# Patient Record
Sex: Male | Born: 1970 | Race: White | Hispanic: No | Marital: Married
Health system: Midwestern US, Academic
[De-identification: ages and names within clinical notes are randomized; demographics above are authoritative.]

---

## 2017-02-11 LAB — COMPREHENSIVE METABOLIC PANEL
Lab: 139
Lab: 18 — ABNORMAL HIGH (ref <150)
Lab: 4.2
Lab: 4.4
Lab: 9.9
Lab: 96

## 2017-02-11 LAB — CBC
Lab: 48
Lab: 7.4

## 2017-06-13 LAB — LIPID PROFILE
Lab: 103 — ABNORMAL HIGH (ref <100)
Lab: 180
Lab: 44

## 2017-08-02 ENCOUNTER — Encounter: Admit: 2017-08-02 | Discharge: 2017-08-02 | Payer: BC Managed Care – PPO

## 2017-08-05 MED ORDER — LOSARTAN 50 MG PO TAB
ORAL_TABLET | Freq: Every day | ORAL | 0 refills | 30.00000 days | Status: AC
Start: 2017-08-05 — End: 2017-08-08

## 2017-08-08 ENCOUNTER — Encounter: Admit: 2017-08-08 | Discharge: 2017-08-08 | Payer: BC Managed Care – PPO

## 2017-08-08 ENCOUNTER — Ambulatory Visit: Admit: 2017-08-08 | Discharge: 2017-08-09 | Payer: BC Managed Care – PPO

## 2017-08-08 DIAGNOSIS — R002 Palpitations: Principal | ICD-10-CM

## 2017-08-08 DIAGNOSIS — Z8249 Family history of ischemic heart disease and other diseases of the circulatory system: ICD-10-CM

## 2017-08-08 DIAGNOSIS — F419 Anxiety disorder, unspecified: ICD-10-CM

## 2017-08-08 DIAGNOSIS — I10 Essential (primary) hypertension: ICD-10-CM

## 2017-08-08 DIAGNOSIS — E6609 Other obesity due to excess calories: ICD-10-CM

## 2017-08-08 DIAGNOSIS — I4510 Unspecified right bundle-branch block: ICD-10-CM

## 2017-08-08 MED ORDER — AMLODIPINE 5 MG PO TAB
5 mg | ORAL_TABLET | Freq: Two times a day (BID) | ORAL | 3 refills | Status: AC
Start: 2017-08-08 — End: 2019-07-09

## 2017-08-08 MED ORDER — LOSARTAN 50 MG PO TAB
50 mg | ORAL_TABLET | Freq: Two times a day (BID) | ORAL | 3 refills | 30.00000 days | Status: AC
Start: 2017-08-08 — End: 2018-03-04

## 2017-08-08 NOTE — Progress Notes
Date of Service: 08/08/2017    Mark Pace is a 46 y.o. male.       HPI     This is a 46 year old white male that we have been following up in our office for hypertension and risk factors for coronary artery disease.  Patient was last seen on May 24, 2016.    Today his blood pressure was 164/1 104 mm making the right arm and 154/100 mmHg in the left arm with a heart rate of 78 bpm.  Patient reports that he is compliant with his medications.  He is currently taking amlodipine 10 mg p.o. daily and losartan 50 mg p.o. daily.  He also tries to watch his salt intake.    He has not had any symptoms of chest pain or heart palpitations.    Patient was evaluated with a stress echocardiogram in September 2017, the EKG portion of the study was somewhat difficult to interpret due to artifact, occasional PVCs were present, there are no wall motion, mitis with exercise and thus this test was negative for ischemia.    Also Holter monitor was performed on 05/29/2016, he was not found to have any arrhythmias.         Vitals:    08/08/17 1504   BP: (!) 164/104   Weight: 113.9 kg (251 lb)   Height: 1.854 m (6' 1)     Body mass index is 33.12 kg/m???.     Past Medical History  Patient Active Problem List    Diagnosis Date Noted   ??? Non morbid obesity due to excess calories 04/17/2016   ??? RBBB 04/17/2016   ??? Palpitations 04/12/2016   ??? Hypertension 04/12/2016   ??? Anxiety 04/12/2016   ??? Family history of early CAD 04/12/2016         Review of Systems   Constitution: Negative.   HENT: Positive for ear discharge.    Eyes: Negative.    Cardiovascular: Positive for palpitations.   Respiratory: Negative.    Endocrine: Negative.    Hematologic/Lymphatic: Negative.    Skin: Negative.    Musculoskeletal: Positive for back pain.   Gastrointestinal: Positive for bloating, abdominal pain, constipation and diarrhea.   Genitourinary: Negative.    Neurological: Negative.    Psychiatric/Behavioral: Negative.    Allergic/Immunologic: Negative. Physical Exam  General Appearance: normal in appearance  Skin: warm, moist, no ulcers or xanthomas  Eyes: conjunctivae and lids normal, pupils are equal and round  Lips & Oral Mucosa: no pallor or cyanosis  Neck Veins: neck veins are flat, neck veins are not distended  Chest Inspection: chest is normal in appearance  Respiratory Effort: breathing comfortably, no respiratory distress  Auscultation/Percussion: lungs clear to auscultation, no rales or rhonchi, no wheezing  Cardiac Rhythm: regular rhythm and normal rate  Cardiac Auscultation: S1, S2 normal, no rub, no gallop  Murmurs: no murmur  Carotid Arteries: normal carotid upstroke bilaterally, no bruit  Lower Extremity Edema: no lower extremity edema  Abdominal Exam: soft, non-tender, no masses, bowel sounds normal  Liver & Spleen: no organomegaly  Language and Memory: patient responsive and seems to comprehend information  Neurologic Exam: neurological assessment grossly intact      Cardiovascular Studies  Twelve-lead EKG demonstrates normal sinus rhythm, right bundle branch block    Problems Addressed Today  Encounter Diagnoses   Name Primary?   ??? Essential hypertension Yes   ??? RBBB    ??? Palpitations    ??? Anxiety    ??? Family history  of early CAD    ??? Non morbid obesity due to excess calories        Assessment and Plan     In summary: This is a 46 year old white male with no documented history of coronary artery disease, suboptimally controlled blood pressure, patient reports compliance with medications and the low-salt diet, she also has a right bundle branch block on the twelve-lead surface EKG.    In the light of the above I recommend the following:    1. Change amlodipine to 5 mg p.o. twice daily  2. Increase losartan to 50 mg p.o. twice daily  3. I did recommend a low-salt diet  4. I also asked the patient to buy a blood pressure machine at home and monitor his blood pressure and heart rate at home 5. Follow-up office visit in 3 months, patient will bring with him the home recordings, and based on those numbers will make further adjustments in his antihypertensive drug therapy, if necessary.             Current Medications (including today's revisions)  ??? acetaminophen (TYLENOL) 500 mg tablet Take 500 mg by mouth twice daily as needed for Pain. Max of 4,000 mg of acetaminophen in 24 hours.   ??? amLODIPine (NORVASC) 10 mg tablet Take 10 mg by mouth daily.   ??? duloxetine DR (CYMBALTA) 60 mg capsule Take 60 mg by mouth daily.   ??? losartan (COZAAR) 50 mg tablet TAKE ONE TABLET BY MOUTH ONCE DAILY AT BEDTIME

## 2017-08-22 ENCOUNTER — Encounter: Admit: 2017-08-22 | Discharge: 2017-08-22 | Payer: BC Managed Care – PPO

## 2017-09-05 LAB — COMPREHENSIVE METABOLIC PANEL
Lab: 0.5
Lab: 0.8 — ABNORMAL LOW (ref 33.0–37.0)
Lab: 10
Lab: 103
Lab: 138
Lab: 15
Lab: 21
Lab: 25
Lab: 31
Lab: 4.2
Lab: 7.4
Lab: 87
Lab: 92

## 2017-09-05 LAB — CBC: Lab: 6.3

## 2017-11-12 ENCOUNTER — Encounter: Admit: 2017-11-12 | Discharge: 2017-11-12 | Payer: BC Managed Care – PPO

## 2017-11-12 ENCOUNTER — Ambulatory Visit: Admit: 2017-11-12 | Discharge: 2017-11-13 | Payer: BC Managed Care – PPO

## 2017-11-12 DIAGNOSIS — I4510 Unspecified right bundle-branch block: ICD-10-CM

## 2017-11-12 DIAGNOSIS — I10 Essential (primary) hypertension: ICD-10-CM

## 2017-11-12 DIAGNOSIS — R002 Palpitations: Principal | ICD-10-CM

## 2017-11-12 DIAGNOSIS — Z8249 Family history of ischemic heart disease and other diseases of the circulatory system: ICD-10-CM

## 2017-11-12 DIAGNOSIS — E6609 Other obesity due to excess calories: ICD-10-CM

## 2017-11-12 DIAGNOSIS — F419 Anxiety disorder, unspecified: ICD-10-CM

## 2017-11-12 DIAGNOSIS — E782 Mixed hyperlipidemia: ICD-10-CM

## 2017-11-12 MED ORDER — HYDROCHLOROTHIAZIDE 25 MG PO TAB
25 mg | ORAL_TABLET | Freq: Every morning | ORAL | 3 refills | 28.00000 days | Status: AC
Start: 2017-11-12 — End: 2018-03-12

## 2018-03-04 ENCOUNTER — Encounter: Admit: 2018-03-04 | Discharge: 2018-03-04 | Payer: BC Managed Care – PPO

## 2018-03-04 ENCOUNTER — Ambulatory Visit: Admit: 2018-03-04 | Discharge: 2018-03-05 | Payer: BC Managed Care – PPO

## 2018-03-04 DIAGNOSIS — E6609 Other obesity due to excess calories: ICD-10-CM

## 2018-03-04 DIAGNOSIS — E782 Mixed hyperlipidemia: ICD-10-CM

## 2018-03-04 DIAGNOSIS — I4510 Unspecified right bundle-branch block: ICD-10-CM

## 2018-03-04 DIAGNOSIS — R002 Palpitations: ICD-10-CM

## 2018-03-04 DIAGNOSIS — R634 Abnormal weight loss: ICD-10-CM

## 2018-03-04 DIAGNOSIS — Z8249 Family history of ischemic heart disease and other diseases of the circulatory system: ICD-10-CM

## 2018-03-04 DIAGNOSIS — I10 Essential (primary) hypertension: ICD-10-CM

## 2018-03-04 DIAGNOSIS — F419 Anxiety disorder, unspecified: ICD-10-CM

## 2018-03-04 MED ORDER — LOSARTAN 50 MG PO TAB
50 mg | ORAL_TABLET | Freq: Two times a day (BID) | ORAL | 3 refills | 30.00000 days | Status: AC
Start: 2018-03-04 — End: 2019-07-09

## 2018-03-06 ENCOUNTER — Encounter: Admit: 2018-03-06 | Discharge: 2018-03-06 | Payer: BC Managed Care – PPO

## 2018-03-06 DIAGNOSIS — I10 Essential (primary) hypertension: Principal | ICD-10-CM

## 2018-03-12 ENCOUNTER — Encounter: Admit: 2018-03-12 | Discharge: 2018-03-12 | Payer: BC Managed Care – PPO

## 2018-03-12 MED ORDER — HYDROCHLOROTHIAZIDE 25 MG PO TAB
ORAL_TABLET | Freq: Every day | ORAL | 3 refills | 28.00000 days | Status: AC
Start: 2018-03-12 — End: 2018-06-12

## 2018-03-17 ENCOUNTER — Encounter: Admit: 2018-03-17 | Discharge: 2018-03-17 | Payer: BC Managed Care – PPO

## 2018-06-04 LAB — LIPID PROFILE

## 2018-06-12 ENCOUNTER — Encounter: Admit: 2018-06-12 | Discharge: 2018-06-12 | Payer: BC Managed Care – PPO

## 2018-06-12 MED ORDER — HYDROCHLOROTHIAZIDE 25 MG PO TAB
25 mg | ORAL_TABLET | Freq: Every day | ORAL | 3 refills | 28.00000 days | Status: AC
Start: 2018-06-12 — End: 2018-10-07

## 2018-10-07 ENCOUNTER — Ambulatory Visit: Admit: 2018-10-07 | Discharge: 2018-10-07 | Payer: BC Managed Care – PPO

## 2018-10-07 ENCOUNTER — Encounter: Admit: 2018-10-07 | Discharge: 2018-10-07 | Payer: BC Managed Care – PPO

## 2018-10-07 DIAGNOSIS — R002 Palpitations: Principal | ICD-10-CM

## 2018-10-07 DIAGNOSIS — E7800 Pure hypercholesterolemia, unspecified: Principal | ICD-10-CM

## 2018-10-07 DIAGNOSIS — I4510 Unspecified right bundle-branch block: ICD-10-CM

## 2018-10-07 DIAGNOSIS — F419 Anxiety disorder, unspecified: ICD-10-CM

## 2018-10-07 DIAGNOSIS — Z8249 Family history of ischemic heart disease and other diseases of the circulatory system: ICD-10-CM

## 2018-10-07 DIAGNOSIS — I10 Essential (primary) hypertension: ICD-10-CM

## 2018-10-07 DIAGNOSIS — E782 Mixed hyperlipidemia: ICD-10-CM

## 2018-10-07 MED ORDER — CHLORTHALIDONE 25 MG PO TAB
25 mg | ORAL_TABLET | Freq: Every day | ORAL | 3 refills | Status: AC
Start: 2018-10-07 — End: 2019-02-04

## 2018-10-07 NOTE — Progress Notes
Gastrointestinal: Positive for bloating, abdominal pain, constipation, diarrhea, heartburn and nausea.   Genitourinary: Negative.    Neurological: Positive for headaches.   Psychiatric/Behavioral: Negative.    Allergic/Immunologic: Negative.        Physical Exam  General Appearance: normal in appearance  Skin: warm, moist, no ulcers or xanthomas  Eyes: conjunctivae and lids normal, pupils are equal and round  Lips & Oral Mucosa: no pallor or cyanosis  Neck Veins: neck veins are flat, neck veins are not distended  Chest Inspection: chest is normal in appearance  Respiratory Effort: breathing comfortably, no respiratory distress  Auscultation/Percussion: lungs clear to auscultation, no rales or rhonchi, no wheezing  Cardiac Rhythm: regular rhythm and normal rate  Cardiac Auscultation: S1, S2 normal, no rub, no gallop  Murmurs: no murmur  Carotid Arteries: normal carotid upstroke bilaterally, no bruit  Lower Extremity Edema: no lower extremity edema  Abdominal Exam: soft, non-tender, no masses, bowel sounds normal  Liver & Spleen: no organomegaly  Language and Memory: patient responsive and seems to comprehend information  Neurologic Exam: neurological assessment grossly intact      Cardiovascular Studies  Twelve-lead EKG demonstrates normal sinus rhythm, right bundle branch block, left axis deviation and left anterior fascicular block    Problems Addressed Today  Encounter Diagnoses   Name Primary?   ??? Pure hypercholesterolemia Yes   ??? Essential hypertension    ??? RBBB    ??? Mixed hyperlipidemia    ??? Family history of early CAD    ??? Palpitations        Assessment and Plan     In summary: This is a 48 year old white male with nonobstructive renal artery stenosis by abdominal ultrasound performed at the outside hospital, he does have suboptimally controlled hypertension, hyperlipidemia, he is probably not completely compliant with a low-salt diet.    Plan:    1.  Continue amlodipine and continue losartan

## 2018-10-09 ENCOUNTER — Encounter: Admit: 2018-10-09 | Discharge: 2018-10-09 | Payer: BC Managed Care – PPO

## 2018-12-16 ENCOUNTER — Encounter: Admit: 2018-12-16 | Discharge: 2018-12-16 | Payer: BC Managed Care – PPO

## 2018-12-17 ENCOUNTER — Ambulatory Visit: Admit: 2018-12-17 | Discharge: 2018-12-18 | Payer: BC Managed Care – PPO

## 2018-12-17 ENCOUNTER — Encounter: Admit: 2018-12-17 | Discharge: 2018-12-17 | Payer: BC Managed Care – PPO

## 2018-12-17 DIAGNOSIS — E7800 Pure hypercholesterolemia, unspecified: Principal | ICD-10-CM

## 2018-12-17 DIAGNOSIS — Z8249 Family history of ischemic heart disease and other diseases of the circulatory system: ICD-10-CM

## 2018-12-17 DIAGNOSIS — I10 Essential (primary) hypertension: ICD-10-CM

## 2018-12-17 DIAGNOSIS — I4510 Unspecified right bundle-branch block: Principal | ICD-10-CM

## 2018-12-17 DIAGNOSIS — F419 Anxiety disorder, unspecified: ICD-10-CM

## 2018-12-17 DIAGNOSIS — E6609 Other obesity due to excess calories: ICD-10-CM

## 2018-12-17 DIAGNOSIS — E782 Mixed hyperlipidemia: ICD-10-CM

## 2018-12-17 DIAGNOSIS — R002 Palpitations: Principal | ICD-10-CM

## 2018-12-17 MED ORDER — PERFLUTREN LIPID MICROSPHERES 1.1 MG/ML IV SUSP
1-20 mL | Freq: Once | INTRAVENOUS | 0 refills | Status: CP | PRN
Start: 2018-12-17 — End: ?

## 2018-12-17 NOTE — Progress Notes
1.  I did asked the patient to continue all current medications  2.  We did discuss again about the importance of low-salt diet, low sugar, low carbohydrate, low-fat and low-cholesterol  3.  Follow-up office visit in approximately 6 months.         Current Medications (including today's revisions)  ??? acetaminophen (TYLENOL) 500 mg tablet Take 500 mg by mouth twice daily as needed for Pain. Max of 4,000 mg of acetaminophen in 24 hours.   ??? amLODIPine (NORVASC) 5 mg tablet Take one tablet by mouth twice daily.   ??? chlorthalidone (HYGROTON) 25 mg tablet Take one tablet by mouth daily. Indications: high blood pressure   ??? duloxetine DR (CYMBALTA) 30 mg capsule Take 30 mg by mouth at bedtime daily.   ??? duloxetine DR (CYMBALTA) 60 mg capsule Take 60 mg by mouth daily.   ??? losartan (COZAAR) 50 mg tablet Take one tablet by mouth twice daily. Indications: high blood pressure   ??? psyllium (METAMUCIL) 3.4 gram packet Take 1 packet by mouth daily.

## 2019-02-03 ENCOUNTER — Encounter: Admit: 2019-02-03 | Discharge: 2019-02-03

## 2019-02-04 MED ORDER — CHLORTHALIDONE 25 MG PO TAB
ORAL_TABLET | Freq: Every day | 3 refills | Status: DC
Start: 2019-02-04 — End: 2019-07-09

## 2019-06-30 ENCOUNTER — Encounter: Admit: 2019-06-30 | Discharge: 2019-06-30 | Payer: BC Managed Care – PPO

## 2019-07-09 ENCOUNTER — Encounter: Admit: 2019-07-09 | Discharge: 2019-07-09 | Payer: BC Managed Care – PPO

## 2019-07-09 DIAGNOSIS — I4510 Unspecified right bundle-branch block: Secondary | ICD-10-CM

## 2019-07-09 DIAGNOSIS — Z8249 Family history of ischemic heart disease and other diseases of the circulatory system: Secondary | ICD-10-CM

## 2019-07-09 DIAGNOSIS — R002 Palpitations: Secondary | ICD-10-CM

## 2019-07-09 DIAGNOSIS — I10 Essential (primary) hypertension: Secondary | ICD-10-CM

## 2019-07-09 DIAGNOSIS — E782 Mixed hyperlipidemia: Secondary | ICD-10-CM

## 2019-07-09 DIAGNOSIS — F419 Anxiety disorder, unspecified: Secondary | ICD-10-CM

## 2019-07-09 MED ORDER — LOSARTAN 50 MG PO TAB
50 mg | ORAL_TABLET | Freq: Two times a day (BID) | ORAL | 3 refills | 30.00000 days | Status: AC
Start: 2019-07-09 — End: ?

## 2019-07-09 MED ORDER — AMLODIPINE 5 MG PO TAB
5 mg | ORAL_TABLET | Freq: Two times a day (BID) | ORAL | 3 refills | Status: DC
Start: 2019-07-09 — End: 2020-02-08

## 2019-07-09 MED ORDER — CHLORTHALIDONE 25 MG PO TAB
25 mg | ORAL_TABLET | Freq: Every day | ORAL | 3 refills | Status: AC
Start: 2019-07-09 — End: ?

## 2020-02-06 ENCOUNTER — Encounter: Admit: 2020-02-06 | Discharge: 2020-02-06 | Payer: BC Managed Care – PPO

## 2020-02-08 MED ORDER — AMLODIPINE 5 MG PO TAB
ORAL_TABLET | Freq: Two times a day (BID) | 3 refills | Status: AC
Start: 2020-02-08 — End: ?

## 2020-03-31 ENCOUNTER — Encounter: Admit: 2020-03-31 | Discharge: 2020-03-31 | Payer: BC Managed Care – PPO

## 2020-03-31 DIAGNOSIS — I4510 Unspecified right bundle-branch block: Secondary | ICD-10-CM

## 2020-03-31 DIAGNOSIS — Z8249 Family history of ischemic heart disease and other diseases of the circulatory system: Secondary | ICD-10-CM

## 2020-03-31 DIAGNOSIS — E6609 Other obesity due to excess calories: Secondary | ICD-10-CM

## 2020-03-31 DIAGNOSIS — E782 Mixed hyperlipidemia: Secondary | ICD-10-CM

## 2020-03-31 DIAGNOSIS — R635 Abnormal weight gain: Secondary | ICD-10-CM

## 2020-03-31 DIAGNOSIS — F419 Anxiety disorder, unspecified: Secondary | ICD-10-CM

## 2020-03-31 DIAGNOSIS — Z289 Immunization not carried out for unspecified reason: Secondary | ICD-10-CM

## 2020-03-31 DIAGNOSIS — I10 Essential (primary) hypertension: Secondary | ICD-10-CM

## 2020-03-31 DIAGNOSIS — R002 Palpitations: Secondary | ICD-10-CM

## 2020-03-31 NOTE — Progress Notes
Date of Service: 03/31/2020    Mark Pace is a 49 y.o. male.       HPI     We have been following up in our office Mark Pace for blood pressure related issues.  He was last evaluated in November 2020.    He does have a history of hypertension, obesity with a BMI of 34.70 kg/m? (since July 2019 patient gained 21 pounds), and he has a family history of premature coronary artery disease.    He was evaluated with an echocardiogram in April 2020, there is no evidence of structural abnormalities, the diastolic function was normal.    Patient does not report symptoms of chest pain, no shortness of breath and no heart palpitations.  His blood pressure today is mildly elevated, with the record 142/86 mmHg with a heart rate of 76 bpm.  Patient states that he just drank coffee before he came to the office.       Vitals:    03/31/20 0944   Weight: 119.3 kg (263 lb)   Height: 1.854 m (6' 1)   PainSc: Zero     Body mass index is 34.7 kg/m?Marland Kitchen     Past Medical History  Patient Active Problem List    Diagnosis Date Noted   ? Weight loss 03/04/2018   ? Mixed hyperlipidemia 11/12/2017   ? Non morbid obesity due to excess calories 04/17/2016   ? RBBB 04/17/2016   ? Palpitations 04/12/2016   ? Hypertension 04/12/2016   ? Anxiety 04/12/2016   ? Family history of early CAD 04/12/2016         Review of Systems   Constitutional: Negative.   HENT: Negative.    Eyes: Negative.    Cardiovascular: Negative.    Respiratory: Negative.    Endocrine: Negative.    Hematologic/Lymphatic: Negative.    Skin: Negative.    Musculoskeletal: Negative.    Gastrointestinal: Negative.    Genitourinary: Negative.    Neurological: Negative.    Psychiatric/Behavioral: Negative.    Allergic/Immunologic: Negative.        Physical Exam  General Appearance: normal in appearance  Skin: warm, moist, no ulcers or xanthomas  Eyes: conjunctivae and lids normal, pupils are equal and round  Lips & Oral Mucosa: no pallor or cyanosis  Neck Veins: neck veins are flat, neck veins are not distended  Chest Inspection: chest is normal in appearance  Respiratory Effort: breathing comfortably, no respiratory distress  Auscultation/Percussion: lungs clear to auscultation, no rales or rhonchi, no wheezing  Cardiac Rhythm: regular rhythm and normal rate  Cardiac Auscultation: S1, S2 normal, no rub, no gallop  Murmurs: no murmur  Carotid Arteries: normal carotid upstroke bilaterally, no bruit  Lower Extremity Edema: no lower extremity edema  Abdominal Exam: soft, non-tender, no masses, bowel sounds normal  Liver & Spleen: no organomegaly  Language and Memory: patient responsive and seems to comprehend information  Neurologic Exam: neurological assessment grossly intact      Cardiovascular Studies  Twelve-lead EKG demonstrated normal sinus rhythm, no ST segment T wave changes, right bundle branch block    Problems Addressed Today  Encounter Diagnoses   Name Primary?   ? Essential hypertension Yes   ? Mixed hyperlipidemia    ? RBBB    ? Family history of early CAD    ? Non morbid obesity due to excess calories    ? Palpitations        Assessment and Plan     In summary: This  is a 49 year old white male with a history of hypertension, he is on 3 antihypertensive drug therapy, this includes an dihydropyridine calcium channel blocker, chlorthalidone and an ARB, today his blood pressure is not optimally controlled.    Patient did gain 21 pounds since July 2019, he is probably not compliant with a low-sodium diet.  Overall, he has remained asymptomatic from a cardiac standpoint.    Plan:    1.  Continue all current medications  2.  I do recommend risk factors modification, weight reduction, low-fat, low-cholesterol, low sugar, low carbohydrate and strict low-sodium diet  3.  Patient can have a fasting lipid and liver profile checked in your office, he does have an indication for statin therapy.  4.  Follow-up office visit in 1 year.         Current Medications (including today's revisions)  ? acetaminophen (TYLENOL) 500 mg tablet Take 500 mg by mouth twice daily as needed for Pain. Max of 4,000 mg of acetaminophen in 24 hours.   ? amLODIPine (NORVASC) 5 mg tablet TAKE 1 TABLET BY MOUTH TWICE DAILY FOR HIGH BLOOD PRESSURE   ? chlorthalidone (HYGROTON) 25 mg tablet Take one tablet by mouth daily. Indications: high blood pressure   ? duloxetine DR (CYMBALTA) 30 mg capsule Take 30 mg by mouth at bedtime daily.   ? duloxetine DR (CYMBALTA) 60 mg capsule Take 60 mg by mouth daily.   ? ibuprofen (ADVIL) 200 mg tablet Take 600-800 mg by mouth as Needed for Pain. Take with food.   ? losartan (COZAAR) 50 mg tablet Take one tablet by mouth twice daily. Indications: high blood pressure   ? psyllium (METAMUCIL) 3.4 gram packet Take 1 packet by mouth daily.

## 2020-07-12 ENCOUNTER — Encounter: Admit: 2020-07-12 | Discharge: 2020-07-12 | Payer: BC Managed Care – PPO

## 2020-07-12 MED ORDER — LOSARTAN 50 MG PO TAB
ORAL_TABLET | Freq: Two times a day (BID) | ORAL | 3 refills | 30.00000 days | Status: AC
Start: 2020-07-12 — End: ?

## 2020-07-28 ENCOUNTER — Encounter: Admit: 2020-07-28 | Discharge: 2020-07-28 | Payer: BC Managed Care – PPO

## 2020-07-28 MED ORDER — CHLORTHALIDONE 25 MG PO TAB
ORAL_TABLET | Freq: Every day | 0 refills
Start: 2020-07-28 — End: ?

## 2021-02-22 ENCOUNTER — Encounter: Admit: 2021-02-22 | Discharge: 2021-02-22 | Payer: BC Managed Care – PPO

## 2021-02-22 MED ORDER — AMLODIPINE 5 MG PO TAB
ORAL_TABLET | Freq: Two times a day (BID) | 3 refills | Status: AC
Start: 2021-02-22 — End: ?

## 2021-02-22 NOTE — Telephone Encounter
Received a request via computer from the patients pharmacy requesting a refill.  Script e-scribed as requested.      Pt has upcoming OV 8/9.

## 2021-03-28 ENCOUNTER — Encounter: Admit: 2021-03-28 | Discharge: 2021-03-28 | Payer: BC Managed Care – PPO

## 2021-04-04 ENCOUNTER — Encounter: Admit: 2021-04-04 | Discharge: 2021-04-04 | Payer: BC Managed Care – PPO

## 2021-04-04 ENCOUNTER — Encounter: Admit: 2021-04-04 | Discharge: 2021-04-04 | Payer: Private Health Insurance - Indemnity

## 2021-04-04 DIAGNOSIS — R002 Palpitations: Secondary | ICD-10-CM

## 2021-04-04 DIAGNOSIS — I10 Essential (primary) hypertension: Secondary | ICD-10-CM

## 2021-04-04 DIAGNOSIS — Z8249 Family history of ischemic heart disease and other diseases of the circulatory system: Secondary | ICD-10-CM

## 2021-04-04 DIAGNOSIS — I4510 Unspecified right bundle-branch block: Secondary | ICD-10-CM

## 2021-04-04 DIAGNOSIS — F419 Anxiety disorder, unspecified: Secondary | ICD-10-CM

## 2021-04-04 NOTE — Progress Notes
Date of Service: 04/04/2021    Mark Pace is a 50 y.o. male.       HPI     50 year old Caucasian male past medical history of hypertension with hypertensive heart disease, incomplete right bundle branch block that was evaluated by treadmill stress test back in 2017.  Patient showed excellent exercise capacity, normal heart rate and blood pressure response with no findings for ischemia or significant arrhythmia.  Holter monitor did not show any evidence of other conduction abnormalities or arrhythmias.  Patient has been very compliant and tolerant with his current blood pressure regimen.  Blood pressure have consistently been at goal of 120s over 70s.  Reports a rare palpitation but nothing sustained or problematic.  No orthopnea or PND.  Does not struggle with edema.  Does have chronic constipation but nothing has become more problematic.  No bleeding or bruising concerns.  No activity intolerance or changes.  No history of abnormal blood sugars, cholesterol is in reasonable range, never smoked, no family history of early coronary artery disease.         Vitals:    04/04/21 1301   BP: 122/80   BP Source: Arm, Left Upper   Pulse: 71   SpO2: 100%   O2 Device: None (Room air)   PainSc: Zero   Weight: 117.9 kg (260 lb)   Height: 185.4 cm (6' 1)     Body mass index is 34.3 kg/m?Marland Kitchen     Past Medical History  Patient Active Problem List    Diagnosis Date Noted   ? COVID-19 vaccine series not administered 03/31/2020   ? Weight gain 03/31/2020   ? Weight loss 03/04/2018   ? Mixed hyperlipidemia 11/12/2017   ? Non morbid obesity due to excess calories 04/17/2016   ? RBBB 04/17/2016   ? Palpitations 04/12/2016   ? Hypertension 04/12/2016   ? Anxiety 04/12/2016   ? Family history of early CAD 04/12/2016         Review of Systems   Constitutional: Negative.   HENT: Negative.    Eyes: Negative.    Cardiovascular: Negative.    Respiratory: Negative.    Endocrine: Negative.    Hematologic/Lymphatic: Negative.    Skin: Negative. Musculoskeletal: Negative.    Gastrointestinal: Negative.    Genitourinary: Negative.    Neurological: Negative.    Psychiatric/Behavioral: Negative.    Allergic/Immunologic: Negative.        Physical Exam  Patient is awake and alert in no acute distress.  Well-nourished and well kept  Pupils are equal react without scleral injection  Neck is supple nontender upstroke and no bruits.  No jugular venous abnormalities or masses  Lungs are clear to auscultation with good rise and fall the chest  Heart S1, S2 that are normal.  No murmurs, clicks, or gallops  Abdomen is soft and nontender with positive bowel sounds  Pulse are 2+, regular, and symmetric bilaterally radial as well as dorsalis pedis pulses  No peripheral edema and normal hair growth.  No significant skin abnormalities.  Has of relatively short fingernails on his hands    Cardiovascular Studies      Cardiovascular Health Factors  Vitals BP Readings from Last 3 Encounters:   04/04/21 122/80   03/31/20 (!) 142/86   07/09/19 124/86     Wt Readings from Last 3 Encounters:   04/04/21 117.9 kg (260 lb)   03/31/20 119.3 kg (263 lb)   07/09/19 118.4 kg (261 lb)     BMI Readings  from Last 3 Encounters:   04/04/21 34.30 kg/m?   03/31/20 34.70 kg/m?   07/09/19 34.43 kg/m?      Smoking Social History     Tobacco Use   Smoking Status Never Smoker   Smokeless Tobacco Never Used      Lipid Profile Cholesterol   Date Value Ref Range Status   11/29/2020 195  Final     HDL   Date Value Ref Range Status   11/29/2020 51  Final     LDL   Date Value Ref Range Status   11/29/2020 124 (H) <100 Final     Triglycerides   Date Value Ref Range Status   11/29/2020 100  Final      Blood Sugar Hemoglobin A1C   Date Value Ref Range Status   02/11/2017 5.3  Final     Glucose   Date Value Ref Range Status   11/29/2020 108 (H) 70 - 105 Final   02/27/2019 107 (H) 70 - 105 Final   09/05/2017 92  Final          Problems Addressed Today  Encounter Diagnoses   Name Primary?   ? Essential hypertension Yes   ? RBBB        Assessment and Plan     Cardiovascular Ines are clinically stable.  Blood pressures are golden rhythm and conduction and been stable.  At this time did reinforce importance of lifestyle modification for long-term management of his risk for cardiovascular disease.  I made no changes medications, I do not advise other testing at this time      I have not instructed him to follow-up with his primary provider in return to cardiovascular care as needed.  Curre other testing at this time.  nt Medications (including today's revisions)  ? acetaminophen (TYLENOL) 500 mg tablet Take 500 mg by mouth twice daily as needed for Pain. Max of 4,000 mg of acetaminophen in 24 hours.   ? amLODIPine (NORVASC) 5 mg tablet TAKE 1 TABLET BY MOUTH TWICE DAILY FOR HIGH BLOOD PRESSURE   ? chlorthalidone (HYGROTON) 25 mg tablet TAKE 1 TABLET BY MOUTH ONCE DAILY FOR HIGH BLOOD PRESSURE   ? duloxetine DR (CYMBALTA) 30 mg capsule Take 30 mg by mouth at bedtime daily.   ? duloxetine DR (CYMBALTA) 60 mg capsule Take 60 mg by mouth daily.   ? ibuprofen (ADVIL) 200 mg tablet Take 600-800 mg by mouth as Needed for Pain. Take with food.   ? losartan (COZAAR) 50 mg tablet TAKE 1 TABLET BY MOUTH TWICE DAILY FOR HIGH BLOOD PRESSURE   ? psyllium (METAMUCIL) 3.4 gram packet Take 1 packet by mouth daily.

## 2021-04-04 NOTE — Patient Instructions
Follow up as needed      Follow up as directed.  Call sooner if issues.  Call the [facility] nursing line at [phone].  Leave a detailed message for the nurse in [place] with how we can assist you and we will call you back.

## 2021-06-26 ENCOUNTER — Encounter: Admit: 2021-06-26 | Discharge: 2021-06-26 | Payer: Private Health Insurance - Indemnity

## 2021-06-26 MED ORDER — AMLODIPINE 10 MG PO TAB
5 mg | ORAL_TABLET | Freq: Two times a day (BID) | ORAL | 3 refills | Status: AC
Start: 2021-06-26 — End: ?

## 2021-07-16 ENCOUNTER — Encounter: Admit: 2021-07-16 | Discharge: 2021-07-16 | Payer: Private Health Insurance - Indemnity

## 2021-07-16 MED ORDER — CHLORTHALIDONE 25 MG PO TAB
ORAL_TABLET | Freq: Every day | 0 refills
Start: 2021-07-16 — End: ?

## 2021-09-11 IMAGING — CT CALCIUM_SCORING(Adult)
2 series · 16 of 20 positions shown, 19 images · non-contrast
Comparison: none

[Series 2: calcium scoring 3.00 qr36 bestdiast 65% · axial · 0.41mm/px · z∈[+1578,+1688]mm · 9 of 89 slices shown, 12 images]
[im 9/89  vessel]
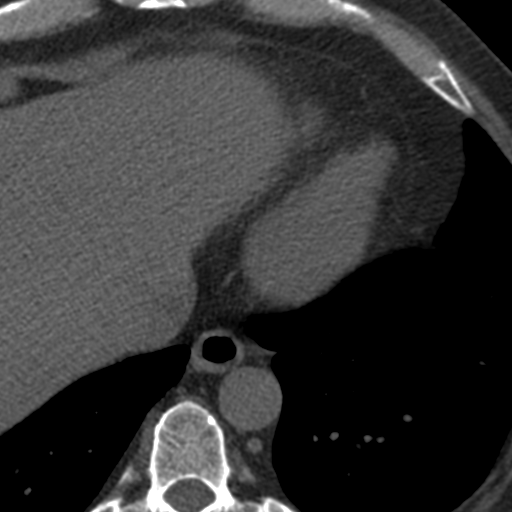
[im 9/89  lung]
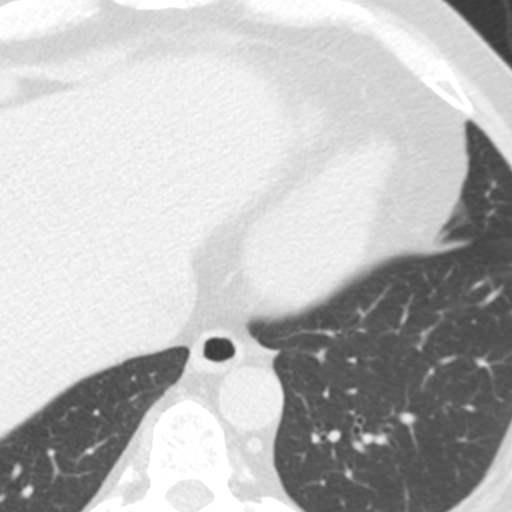
[im 18/89  vessel]
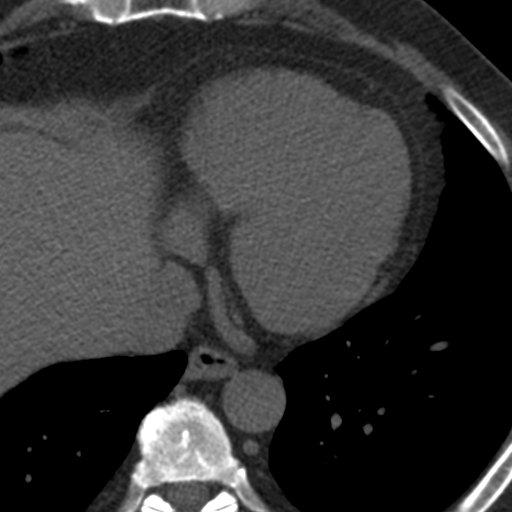
[im 27/89  vessel]
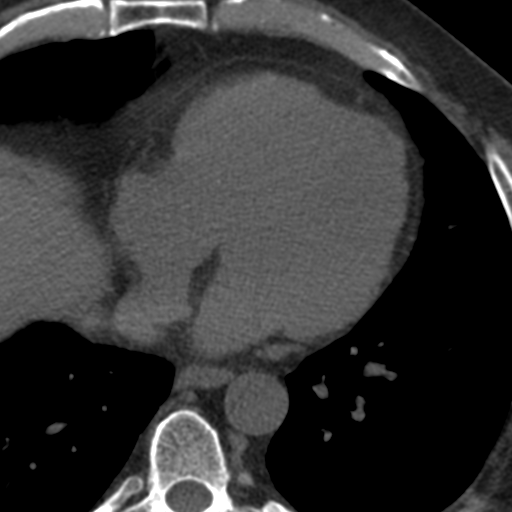
[im 36/89  vessel]
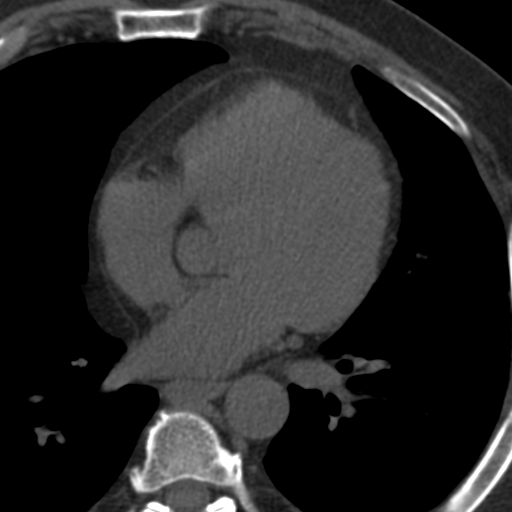
[im 45/89  vessel]
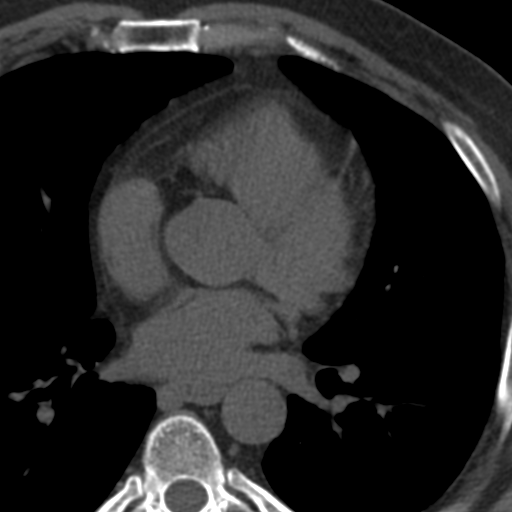
[im 45/89  lung]
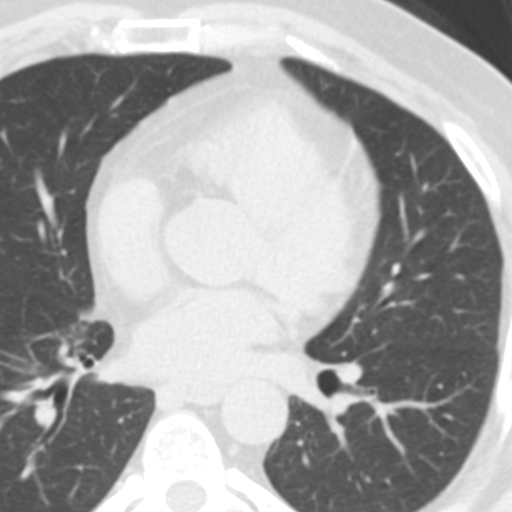
[im 53/89  vessel]
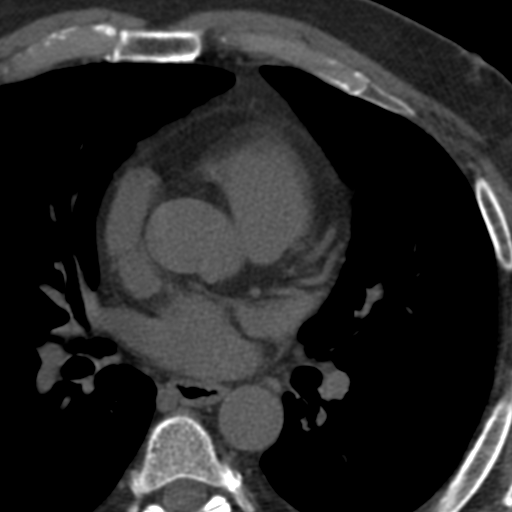
[im 62/89  vessel]
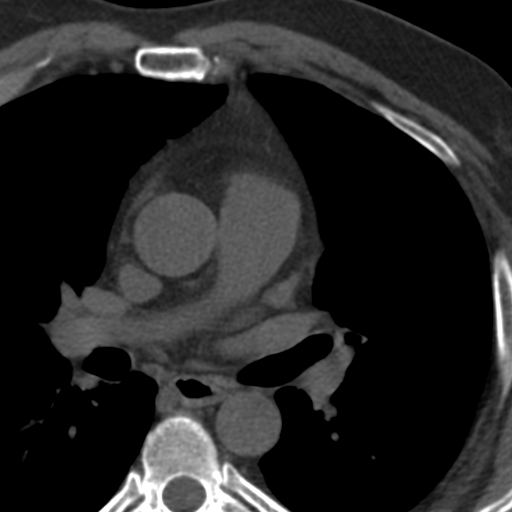
[im 71/89  vessel]
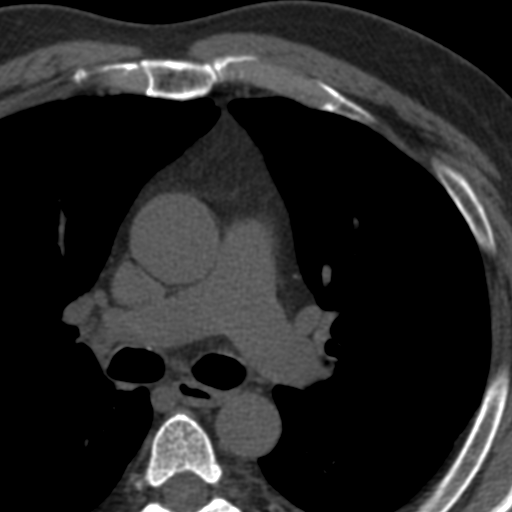
[im 80/89  vessel]
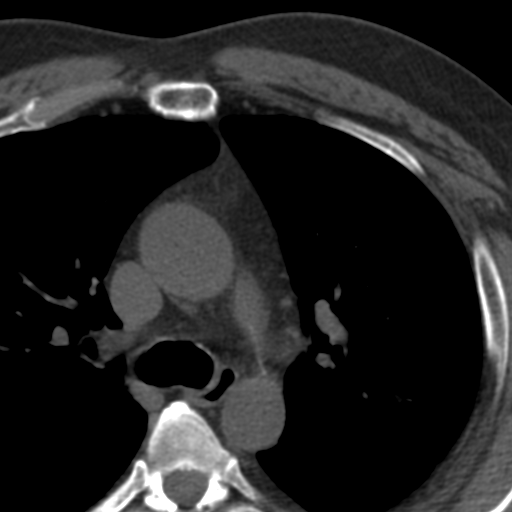
[im 80/89  lung]
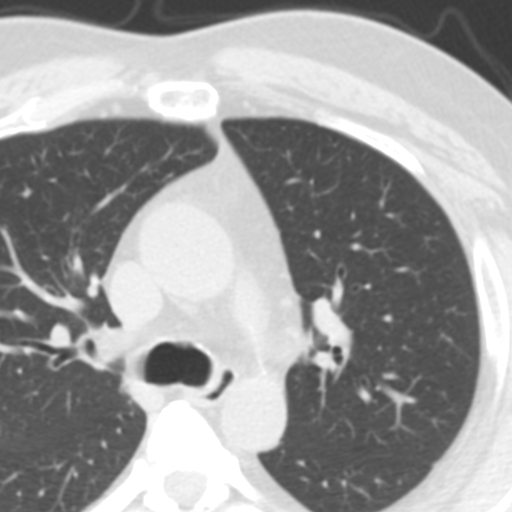

[Series 3: calcium scoring 1.50 br60 bestdiast 65% lung · axial · 0.41mm/px · z∈[+1582,+1685]mm · 7 of 79 slices shown]
[im 10/79  lung]
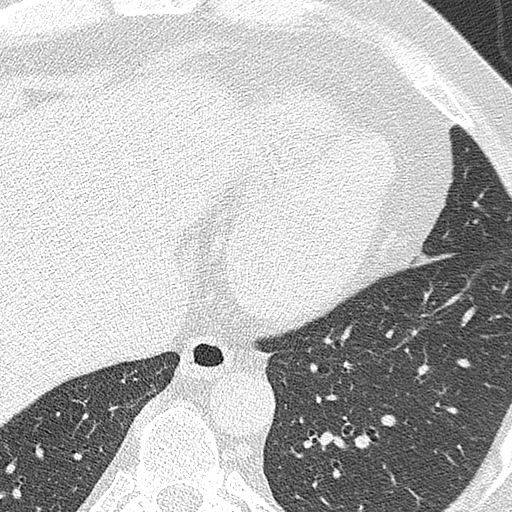
[im 20/79  lung]
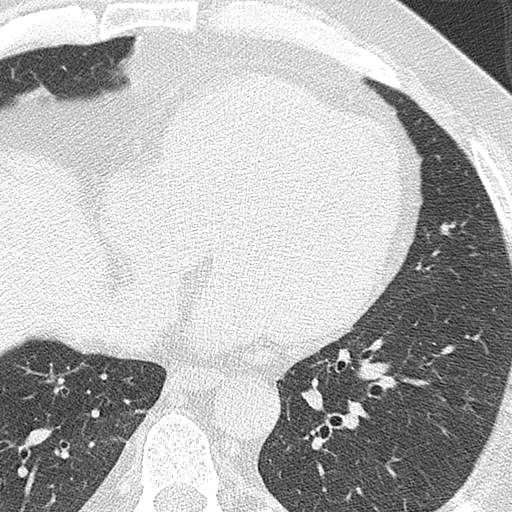
[im 30/79  lung]
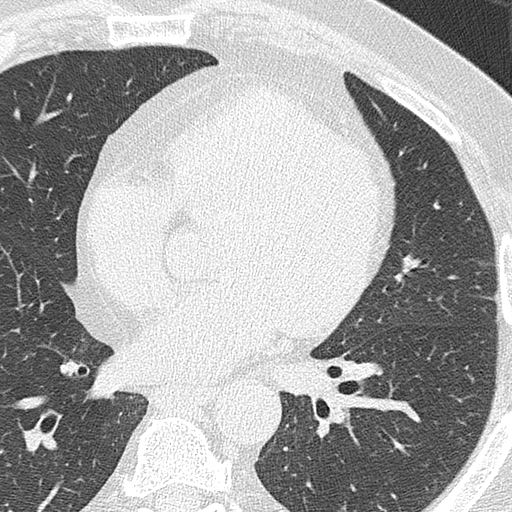
[im 40/79  lung]
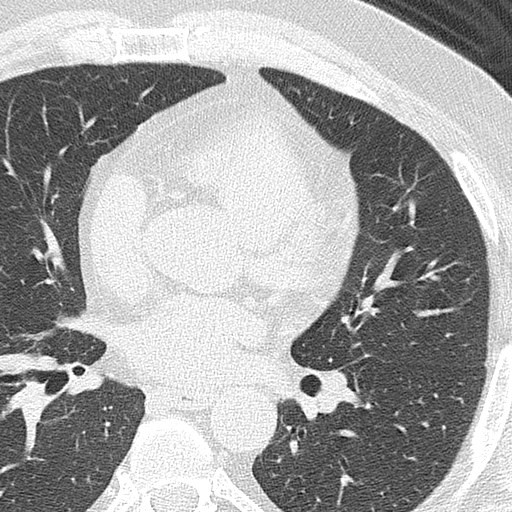
[im 49/79  lung]
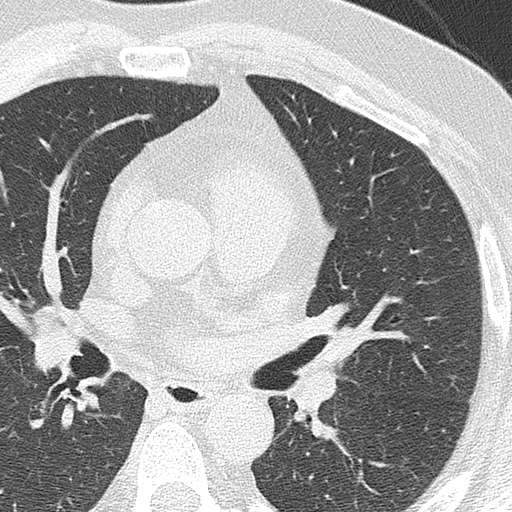
[im 59/79  lung]
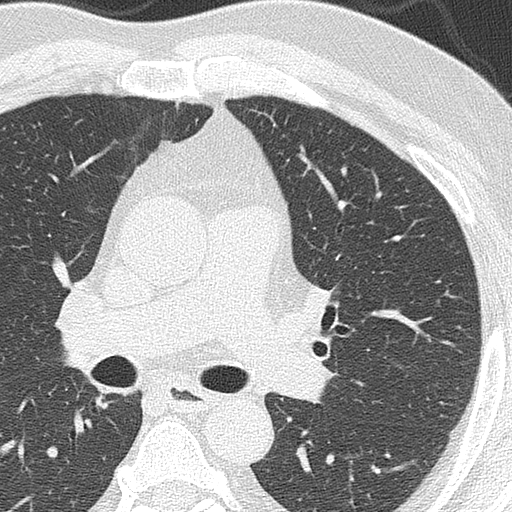
[im 69/79  lung]
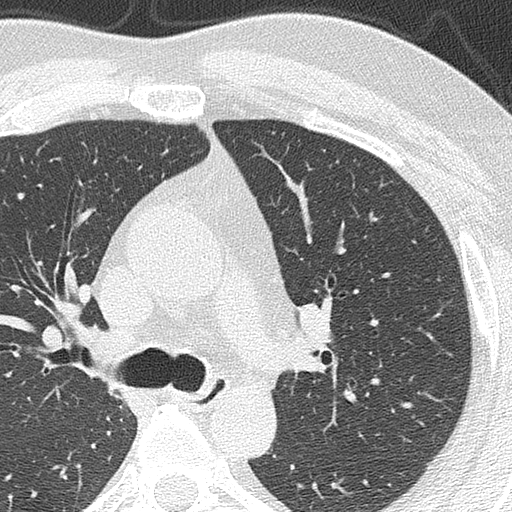

[16 of 20 positions shown; findings below may reference images not displayed]

EXAM

CT cardiac calcium scoring

INDICATION

Screening

TECHNIQUE

Gated calcification scoring has been obtained. All CT scans at this facility use dose modulation,
iterative reconstruction, and/or weight based dosing when appropriate to reduce radiation dose to as
low as reasonably achievable.

# of CT scans in the past year: 0 # of Myocardial perfusion scans this past year: 0

COMPARISONS

None available at the time of dictation.

FINDINGS

Total calcification score is calculated at 0

Left main artery OUSSET 130 score: 0

Left anterior descending OUSSET 130 score: 0

Left circumflex OUSSET 130 score: 0

Right coronary artery OUSSET 130 score: 0

These scores demonstrate no identifiable atherosclerotic plaque and very low cardiovascular disease
risk.

The heart is normal size.

EXTRACARDIAC FINDINGS: No significant abnormal extracardiac findings are identified.

IMPRESSION

1. Coronary calcification score calculated at 0.

Calcium score

0-0: No identifiable atherosclerotic plaque. Very low cardiovascular disease risk.

1-10: Minimal plaque burden. Low cardiovascular disease risk.

11-100: Mild plaque burden. Moderate cardiovascular disease risk.

101-400: Moderate plaque burden. High cardiovascular disease risk.

Greater than 401: Extensive plaque burden. Very High cardiovascular disease risk.

Tech Notes:

PT STATES HX OF HTN. FAMILY HX OF CAD. CT/NM 0/0. TJ

## 2022-05-05 ENCOUNTER — Encounter: Admit: 2022-05-05 | Discharge: 2022-05-05 | Payer: Private Health Insurance - Indemnity

## 2022-05-05 MED ORDER — AMLODIPINE 10 MG PO TAB
ORAL_TABLET | 0 refills
Start: 2022-05-05 — End: ?

## 2022-05-18 ENCOUNTER — Encounter: Admit: 2022-05-18 | Discharge: 2022-05-18 | Payer: Private Health Insurance - Indemnity

## 2022-05-24 ENCOUNTER — Encounter: Admit: 2022-05-24 | Discharge: 2022-05-24 | Payer: BC Managed Care – PPO

## 2022-05-24 DIAGNOSIS — F419 Anxiety disorder, unspecified: Secondary | ICD-10-CM

## 2022-05-24 DIAGNOSIS — I10 Essential (primary) hypertension: Secondary | ICD-10-CM

## 2022-05-24 DIAGNOSIS — E6609 Other obesity due to excess calories: Secondary | ICD-10-CM

## 2022-05-24 DIAGNOSIS — E782 Mixed hyperlipidemia: Secondary | ICD-10-CM

## 2022-05-24 DIAGNOSIS — Z8249 Family history of ischemic heart disease and other diseases of the circulatory system: Secondary | ICD-10-CM

## 2022-05-24 DIAGNOSIS — R002 Palpitations: Secondary | ICD-10-CM

## 2022-05-24 DIAGNOSIS — Z289 Immunization not carried out for unspecified reason: Secondary | ICD-10-CM

## 2022-05-24 DIAGNOSIS — I4510 Unspecified right bundle-branch block: Secondary | ICD-10-CM

## 2022-05-24 DIAGNOSIS — Z136 Encounter for screening for cardiovascular disorders: Secondary | ICD-10-CM

## 2022-05-24 MED ORDER — LOSARTAN 50 MG PO TAB
50 mg | ORAL_TABLET | Freq: Two times a day (BID) | ORAL | 3 refills | 90.00000 days | Status: AC
Start: 2022-05-24 — End: ?

## 2022-05-24 MED ORDER — CHLORTHALIDONE 25 MG PO TAB
25 mg | ORAL_TABLET | Freq: Every day | ORAL | 3 refills | Status: AC
Start: 2022-05-24 — End: ?

## 2022-05-24 NOTE — Progress Notes
Date of Service: 05/24/2022    Mark Pace is a 51 y.o. male.       HPI      Mark Pace is a 51 y.o.  white male with a history of hypertension, obesity, current BMI 35.54 kg/m?, no evidence of coronary artery disease and no current cardiovascular symptoms, history of melanoma, status post resection (left lower posterior thorax).    Patient continues to be on a combination of amlodipine, chlorthalidone and losartan for blood pressure.  Today in the office we did record 134/88 mmHg with a heart rate of 79 bpm.    Patient reports doing well from a cardiovascular: Standpoint without symptoms of chest pain and no heart palpitations.    He is not fully compliant with a low-sodium diet.  Unfortunately he did gain weight, in April 2020 patient weighed 250 pounds, today he weighs 269 pounds.         Vitals:    05/24/22 1050   BP: 134/88   BP Source: Arm, Left Upper   Pulse: 79   SpO2: 96%   O2 Device: None (Room air)   PainSc: Zero   Weight: 122.2 kg (269 lb 6.4 oz)   Height: 185.4 cm (6' 1)     Body mass index is 35.54 kg/m?Marland Kitchen     Past Medical History  Patient Active Problem List    Diagnosis Date Noted   ? COVID-19 vaccine series not administered 03/31/2020   ? Weight gain 03/31/2020   ? Weight loss 03/04/2018   ? Mixed hyperlipidemia 11/12/2017   ? Non morbid obesity due to excess calories 04/17/2016   ? RBBB 04/17/2016   ? Palpitations 04/12/2016   ? Hypertension 04/12/2016   ? Anxiety 04/12/2016   ? Family history of early CAD 04/12/2016         Review of Systems   Constitutional: Negative.   HENT: Negative.    Eyes: Negative.    Cardiovascular: Negative.    Respiratory: Negative.    Endocrine: Negative.    Hematologic/Lymphatic: Negative.    Skin: Negative.    Musculoskeletal: Negative.    Gastrointestinal: Negative.    Genitourinary: Negative.    Neurological: Negative.    Psychiatric/Behavioral: Negative.    Allergic/Immunologic: Negative.        Physical Exam  General Appearance: normal in appearance  Skin: warm, moist, no ulcers or xanthomas  Eyes: conjunctivae and lids normal, pupils are equal and round  Lips & Oral Mucosa: no pallor or cyanosis  Neck Veins: neck veins are flat, neck veins are not distended  Chest Inspection: chest is normal in appearance  Respiratory Effort: breathing comfortably, no respiratory distress  Auscultation/Percussion: lungs clear to auscultation, no rales or rhonchi, no wheezing  Cardiac Rhythm: regular rhythm and normal rate  Cardiac Auscultation: S1, S2 normal, no rub, no gallop  Murmurs: no murmur  Carotid Arteries: normal carotid upstroke bilaterally, no bruit  Lower Extremity Edema: no lower extremity edema  Abdominal Exam: soft, non-tender, no masses, bowel sounds normal  Liver & Spleen: no organomegaly  Language and Memory: patient responsive and seems to comprehend information  Neurologic Exam: neurological assessment grossly intact      Cardiovascular Studies  Twelve-lead EKG demonstrates normal sinus rhythm, no ST segment T wave changes    Cardiovascular Health Factors  Vitals BP Readings from Last 3 Encounters:   05/24/22 134/88   04/04/21 122/80   03/31/20 (!) 142/86     Wt Readings from Last 3 Encounters:  05/24/22 122.2 kg (269 lb 6.4 oz)   04/04/21 117.9 kg (260 lb)   03/31/20 119.3 kg (263 lb)     BMI Readings from Last 3 Encounters:   05/24/22 35.54 kg/m?   04/04/21 34.30 kg/m?   03/31/20 34.70 kg/m?      Smoking Social History     Tobacco Use   Smoking Status Never   Smokeless Tobacco Never      Lipid Profile Cholesterol   Date Value Ref Range Status   12/19/2021 189  Final     HDL   Date Value Ref Range Status   12/19/2021 46  Final     LDL   Date Value Ref Range Status   12/19/2021 111 (H) <100 Final     Triglycerides   Date Value Ref Range Status   12/19/2021 162 (H) <150 Final      Blood Sugar Hemoglobin A1C   Date Value Ref Range Status   02/11/2017 5.3  Final     Glucose   Date Value Ref Range Status   12/19/2021 95  Final   11/29/2020 108 (H) 70 - 105 Final 02/27/2019 107 (H) 70 - 105 Final          Problems Addressed Today  Encounter Diagnoses   Name Primary?   ? Primary hypertension Yes   ? RBBB    ? Mixed hyperlipidemia    ? Palpitations    ? Family history of early CAD    ? Non morbid obesity due to excess calories    ? COVID-19 vaccine series not administered    ? Screening for heart disease        Assessment and Plan     Assessment:    1.  Primary hypertension  ? Currently on 3 antihypertensive drug therapy-reasonable control  2.  Obesity, elevated BMI = 35.54 kg/m?  3.  Noncompliant with a low-sodium diet  4.  Anxiety and depression-currently on SSRI  5.  History of melanoma, status post resection, lower posterior thorax-no recurrence    Plan:    1.  Continue current antihypertensive drug therapy  2.  I did advise weight reduction  3.  I also advised the compliance with a strict low-sodium diet  4.  Follow-up office visit in 10 to 12 months.    Total Time Today was 30 minutes in the following activities: Preparing to see the patient, Obtaining and/or reviewing separately obtained history, Performing a medically appropriate examination and/or evaluation, Counseling and educating the patient/family/caregiver, Ordering medications, tests, or procedures, Referring and communication with other health care professionals (when not separately reported), Documenting clinical information in the electronic or other health record, Independently interpreting results (not separately reported) and communicating results to the patient/family/caregiver and Care coordination (not separately reported)         Current Medications (including today's revisions)  ? acetaminophen (TYLENOL) 500 mg tablet Take one tablet by mouth twice daily as needed for Pain. Max of 4,000 mg of acetaminophen in 24 hours.   ? amLODIPine (NORVASC) 10 mg tablet TAKE 1/2 (ONE-HALF) TABLET BY MOUTH TWICE DAILY FOR BLOOD PRESSURE (Patient taking differently: Take one tablet by mouth daily.)   ? chlorthalidone (HYGROTON) 25 mg tablet TAKE 1 TABLET BY MOUTH ONCE DAILY FOR HIGH BLOOD PRESSURE   ? coffee xt/phosphatidyl serine (NEURIVA ORIGINAL PO) Take  by mouth.   ? duloxetine DR (CYMBALTA) 60 mg capsule Take 30 mg by mouth at bedtime daily.   ? duloxetine DR (CYMBALTA) 60  mg capsule Take one capsule by mouth daily.   ? ibuprofen (ADVIL) 200 mg tablet Take three tablets to four tablets by mouth as Needed for Pain. Take with food.   ? losartan (COZAAR) 50 mg tablet TAKE 1 TABLET BY MOUTH TWICE DAILY FOR HIGH BLOOD PRESSURE   ? MULTIVITAMIN PO Take  by mouth.   ? psyllium (METAMUCIL) 3.4 gram packet Take one packet by mouth daily.

## 2022-08-16 ENCOUNTER — Encounter: Admit: 2022-08-16 | Discharge: 2022-08-16 | Payer: BC Managed Care – PPO

## 2022-08-16 MED ORDER — AMLODIPINE 10 MG PO TAB
10 mg | ORAL_TABLET | Freq: Every day | ORAL | 1 refills | Status: AC
Start: 2022-08-16 — End: ?

## 2022-08-16 NOTE — Telephone Encounter
Received a request via computer from the patients pharmacy requesting a refill.  Script e-scribed as requested.

## 2022-10-25 ENCOUNTER — Encounter: Admit: 2022-10-25 | Discharge: 2022-10-25 | Payer: BC Managed Care – PPO

## 2022-12-02 ENCOUNTER — Encounter: Admit: 2022-12-02 | Discharge: 2022-12-02 | Payer: No Typology Code available for payment source

## 2022-12-02 MED ORDER — LOSARTAN 50 MG PO TAB
ORAL_TABLET | 0 refills
Start: 2022-12-02 — End: ?

## 2023-03-05 ENCOUNTER — Encounter: Admit: 2023-03-05 | Discharge: 2023-03-05 | Payer: No Typology Code available for payment source

## 2023-03-05 MED ORDER — LOSARTAN 50 MG PO TAB
ORAL_TABLET | ORAL | 3 refills | 90.00000 days | Status: AC
Start: 2023-03-05 — End: ?

## 2023-03-23 ENCOUNTER — Encounter: Admit: 2023-03-23 | Discharge: 2023-03-23 | Payer: No Typology Code available for payment source

## 2023-03-23 MED ORDER — AMLODIPINE 10 MG PO TAB
10 mg | ORAL_TABLET | Freq: Every day | ORAL | 0 refills
Start: 2023-03-23 — End: ?

## 2023-04-25 ENCOUNTER — Encounter: Admit: 2023-04-25 | Discharge: 2023-04-25 | Payer: No Typology Code available for payment source

## 2023-07-04 ENCOUNTER — Encounter
Admit: 2023-07-04 | Discharge: 2023-07-04 | Payer: 59 | Primary: Student in an Organized Health Care Education/Training Program

## 2023-07-04 MED ORDER — CHLORTHALIDONE 25 MG PO TAB
25 mg | ORAL_TABLET | Freq: Every day | ORAL | 1 refills | Status: AC
Start: 2023-07-04 — End: ?

## 2023-08-16 ENCOUNTER — Encounter
Admit: 2023-08-16 | Discharge: 2023-08-16 | Payer: 59 | Primary: Student in an Organized Health Care Education/Training Program

## 2023-08-20 ENCOUNTER — Encounter
Admit: 2023-08-20 | Discharge: 2023-08-20 | Payer: PRIVATE HEALTH INSURANCE | Primary: Student in an Organized Health Care Education/Training Program

## 2023-08-20 ENCOUNTER — Ambulatory Visit
Admit: 2023-08-20 | Discharge: 2023-08-20 | Payer: 59 | Primary: Student in an Organized Health Care Education/Training Program

## 2023-08-20 DIAGNOSIS — Z289 Immunization not carried out for unspecified reason: Secondary | ICD-10-CM

## 2023-08-20 DIAGNOSIS — Z136 Encounter for screening for cardiovascular disorders: Secondary | ICD-10-CM

## 2023-08-20 DIAGNOSIS — R002 Palpitations: Secondary | ICD-10-CM

## 2023-08-20 DIAGNOSIS — E782 Mixed hyperlipidemia: Secondary | ICD-10-CM

## 2023-08-20 DIAGNOSIS — I4510 Unspecified right bundle-branch block: Secondary | ICD-10-CM

## 2023-08-20 DIAGNOSIS — I10 Essential (primary) hypertension: Secondary | ICD-10-CM

## 2023-08-20 NOTE — Progress Notes
Date of Service: 08/20/2023    Mark Pace is a 52 y.o. male.       HPI      Mark Pace is a 52 y.o. male with a history of primary hypertension, obesity, current BMI 35.46 kg/m?, history of melanoma (in complete remission, s/p resection on the left lower posterior thorax).    Patient's blood pressure was elevated today we did record 149/95 mmHg.  Patient does not monitor his blood pressure at home.  He is not fully compliant with a low-salt diet.    Has not experienced any symptoms of chest pain and no heart palpitations.         Vitals:    08/20/23 0911   BP: (!) 149/95   BP Source: Arm, Left Upper   Pulse: 72   SpO2: 97%   O2 Device: None (Room air)   PainSc: Five   Weight: 121.9 kg (268 lb 12.8 oz)   Height: 185.4 cm (6' 1)     Body mass index is 35.46 kg/m?Marland Kitchen     Past Medical History  Patient Active Problem List    Diagnosis Date Noted    COVID-19 vaccine series not administered 03/31/2020    Weight gain 03/31/2020    Weight loss 03/04/2018    Mixed hyperlipidemia 11/12/2017    Non morbid obesity due to excess calories 04/17/2016    RBBB 04/17/2016    Palpitations 04/12/2016    Hypertension 04/12/2016    Anxiety 04/12/2016    Family history of early CAD 04/12/2016         Review of Systems   Constitutional: Negative.   HENT: Negative.     Eyes: Negative.    Cardiovascular: Negative.    Respiratory: Negative.     Endocrine: Negative.    Hematologic/Lymphatic: Negative.    Skin: Negative.    Musculoskeletal:  Positive for back pain.   Gastrointestinal: Negative.    Genitourinary: Negative.    Neurological: Negative.    Psychiatric/Behavioral: Negative.     Allergic/Immunologic: Negative.        Physical Exam    General Appearance: normal in appearance  Skin: warm, moist, no ulcers or xanthomas  Eyes: conjunctivae and lids normal, pupils are equal and round  Lips & Oral Mucosa: no pallor or cyanosis  Neck Veins: neck veins are flat, neck veins are not distended  Chest Inspection: chest is normal in appearance  Respiratory Effort: breathing comfortably, no respiratory distress  Auscultation/Percussion: lungs clear to auscultation, no rales or rhonchi, no wheezing  Cardiac Rhythm: regular rhythm and normal rate  Cardiac Auscultation: S1, S2 normal, no rub, no gallop  Murmurs: no murmur  Carotid Arteries: normal carotid upstroke bilaterally, no bruit  Lower Extremity Edema: no lower extremity edema  Abdominal Exam: soft, non-tender, no masses, bowel sounds normal  Liver & Spleen: no organomegaly  Language and Memory: patient responsive and seems to comprehend information  Neurologic Exam: neurological assessment grossly intact    Cardiovascular Studies  Twelve-lead EKG demonstrates normal sinus rhythm, right bundle branch block, no axis deviation, ventricular rate 68 bpm.    Cardiovascular Health Factors  Vitals BP Readings from Last 3 Encounters:   08/20/23 (!) 149/95   05/24/22 134/88   04/04/21 122/80     Wt Readings from Last 3 Encounters:   08/20/23 121.9 kg (268 lb 12.8 oz)   05/24/22 122.2 kg (269 lb 6.4 oz)   04/04/21 117.9 kg (260 lb)     BMI Readings from  Last 3 Encounters:   08/20/23 35.46 kg/m?   05/24/22 35.54 kg/m?   04/04/21 34.30 kg/m?      Smoking Social History     Tobacco Use   Smoking Status Never   Smokeless Tobacco Never      Lipid Profile Cholesterol   Date Value Ref Range Status   02/08/2023 187  Final     HDL   Date Value Ref Range Status   02/08/2023 47  Final     LDL   Date Value Ref Range Status   02/08/2023 118 (H) <100 Final     Triglycerides   Date Value Ref Range Status   02/08/2023 110  Final      Blood Sugar Hemoglobin A1C   Date Value Ref Range Status   02/11/2017 5.3  Final     Glucose   Date Value Ref Range Status   02/08/2023 97  Final   12/19/2021 95  Final   11/29/2020 108 (H) 70 - 105 Final          Problems Addressed Today  Encounter Diagnoses   Name Primary?    RBBB Yes    Mixed hyperlipidemia     Primary hypertension     Palpitations     COVID-19 vaccine series not administered     Screening for heart disease        Assessment and Plan     Assessment:    1.  Primary hypertension  Currently on 3 antihypertensive that include amlodipine, chlorthalidone and losartan  Patient's blood pressure is not optimally controlled  Patient is not compliant with a low-sodium diet  2.  Obesity, elevated BMI = 35.46 kg/m?  3.  Noncompliance with a low-sodium diet  4.  COVID-19 vaccine series not administered  5.  History of heart palpitations-resolved  6.  History of melanoma-status post resection on the left lower posterior thorax, in complete remission  7.  Anxiety and depression-currently on an SSRI    Plan:    1.  I did asked the patient to continue current medications  2.  I advised on a strict low-sodium diet, weight reduction, the goal would be to reduce the oral antihypertensive drug regimen the patient is compliant with a low-sodium diet and loses weight  3.  2D echo Doppler study and office visit with me in August - September 2025    Total Time Today was 30 minutes in the following activities: Preparing to see the patient, Obtaining and/or reviewing separately obtained history, Performing a medically appropriate examination and/or evaluation, Counseling and educating the patient/family/caregiver, Ordering medications, tests, or procedures, Referring and communication with other health care professionals (when not separately reported), Documenting clinical information in the electronic or other health record, Independently interpreting results (not separately reported) and communicating results to the patient/family/caregiver, and Care coordination (not separately reported)          Current Medications (including today's revisions)   acetaminophen (TYLENOL) 500 mg tablet Take one tablet by mouth twice daily as needed for Pain. Max of 4,000 mg of acetaminophen in 24 hours.    amLODIPine (NORVASC) 10 mg tablet Take 1 tablet by mouth once daily    chlorthalidone (HYGROTON) 25 mg tablet TAKE 1 TABLET BY MOUTH ONCE DAILY FOR HIGH BLOOD PRESSURE    coffee xt/phosphatidyl serine (NEURIVA ORIGINAL PO) Take  by mouth.    duloxetine DR (CYMBALTA) 60 mg capsule Take 30 mg by mouth at bedtime daily. (Patient not taking: Reported on 08/20/2023)  duloxetine DR (CYMBALTA) 60 mg capsule Take one capsule by mouth daily.    ibuprofen (ADVIL) 200 mg tablet Take three tablets to four tablets by mouth as Needed for Pain. Take with food.    losartan (COZAAR) 50 mg tablet TAKE 1 TABLET BY MOUTH TWICE DAILY FOR BLOOD PRESSURE    MULTIVITAMIN PO Take  by mouth.    psyllium (METAMUCIL) 3.4 gram packet Take one packet by mouth daily.    tamsulosin (FLOMAX) 0.4 mg capsule Take one capsule by mouth daily.

## 2023-12-28 ENCOUNTER — Encounter
Admit: 2023-12-28 | Discharge: 2023-12-28 | Payer: PRIVATE HEALTH INSURANCE | Primary: Student in an Organized Health Care Education/Training Program

## 2024-03-08 ENCOUNTER — Encounter
Admit: 2024-03-08 | Discharge: 2024-03-08 | Payer: PRIVATE HEALTH INSURANCE | Primary: Student in an Organized Health Care Education/Training Program

## 2024-03-29 ENCOUNTER — Encounter
Admit: 2024-03-29 | Discharge: 2024-03-29 | Payer: PRIVATE HEALTH INSURANCE | Primary: Student in an Organized Health Care Education/Training Program

## 2024-04-24 ENCOUNTER — Encounter
Admit: 2024-04-24 | Discharge: 2024-04-24 | Payer: PRIVATE HEALTH INSURANCE | Primary: Student in an Organized Health Care Education/Training Program

## 2024-04-24 ENCOUNTER — Ambulatory Visit
Admit: 2024-04-24 | Discharge: 2024-04-24 | Payer: PRIVATE HEALTH INSURANCE | Primary: Student in an Organized Health Care Education/Training Program

## 2024-04-27 ENCOUNTER — Encounter
Admit: 2024-04-27 | Discharge: 2024-04-27 | Payer: PRIVATE HEALTH INSURANCE | Primary: Student in an Organized Health Care Education/Training Program

## 2024-06-01 ENCOUNTER — Encounter
Admit: 2024-06-01 | Discharge: 2024-06-01 | Payer: PRIVATE HEALTH INSURANCE | Primary: Student in an Organized Health Care Education/Training Program

## 2024-06-11 ENCOUNTER — Encounter
Admit: 2024-06-11 | Discharge: 2024-06-11 | Payer: PRIVATE HEALTH INSURANCE | Primary: Student in an Organized Health Care Education/Training Program

## 2024-06-11 ENCOUNTER — Ambulatory Visit
Admit: 2024-06-11 | Discharge: 2024-06-11 | Payer: PRIVATE HEALTH INSURANCE | Primary: Student in an Organized Health Care Education/Training Program

## 2024-06-11 VITALS — BP 131/89 | HR 65 | Ht 73.0 in | Wt 271.0 lb

## 2024-06-11 DIAGNOSIS — E782 Mixed hyperlipidemia: Secondary | ICD-10-CM

## 2024-06-11 DIAGNOSIS — K588 Other irritable bowel syndrome: Secondary | ICD-10-CM

## 2024-06-11 DIAGNOSIS — R635 Abnormal weight gain: Secondary | ICD-10-CM

## 2024-06-11 DIAGNOSIS — Z8249 Family history of ischemic heart disease and other diseases of the circulatory system: Secondary | ICD-10-CM

## 2024-06-11 DIAGNOSIS — Z289 Immunization not carried out for unspecified reason: Secondary | ICD-10-CM

## 2024-06-11 DIAGNOSIS — I10 Essential (primary) hypertension: Secondary | ICD-10-CM

## 2024-06-11 DIAGNOSIS — Z85820 Personal history of malignant melanoma of skin: Secondary | ICD-10-CM

## 2024-06-11 DIAGNOSIS — I4510 Unspecified right bundle-branch block: Principal | ICD-10-CM

## 2024-06-11 DIAGNOSIS — E6609 Other obesity due to excess calories: Secondary | ICD-10-CM

## 2024-06-11 MED ORDER — CHLORTHALIDONE 25 MG PO TAB
25 mg | ORAL_TABLET | Freq: Every day | ORAL | 3 refills | 90.00000 days | Status: AC
Start: 2024-06-11 — End: ?

## 2024-06-11 MED ORDER — AMLODIPINE 10 MG PO TAB
10 mg | ORAL_TABLET | Freq: Every day | ORAL | 3 refills | 90.00000 days | Status: AC
Start: 2024-06-11 — End: ?

## 2024-06-11 MED ORDER — LOSARTAN 50 MG PO TAB
50 mg | ORAL_TABLET | Freq: Two times a day (BID) | ORAL | 3 refills | 90.00000 days | Status: AC
Start: 2024-06-11 — End: ?

## 2024-06-11 NOTE — Progress Notes
 Date of Service: 06/11/2024    Mark Pace is a 53 y.o. male.       HPI      Mark Pace is a 53 y.o. male who with a history of primary hypertension (currently on 3 antihypertensive drug therapy including a calcium channel blocker, diuretic and an ARB), obesity, current BMI 35.75 kg/m?, mixed hyperlipidemia (mainly elevated triglycerides), irritable bowel syndrome, history of malignant melanoma (patient has been in complete remission, s/p resection on the left lower posterior thorax), RBBB.    Patient does not have any symptoms of chest pain, no heart palpitations, no presyncope or syncope.    He does not have any documented history of coronary artery disease.    He underwent evaluation with a stress echocardiogram in September 2017-negative for ischemia.-The most recent resting echocardiogram dated 04/24/2024-LVEF = 65%, normal left ventricular diastolic function, no valvular abnormalities, estimated PAP = 19 mmHg.        Vitals:    06/11/24 0915   BP: 131/89   BP Source: Arm, Left Upper   Pulse: 65   SpO2: 96%   O2 Device: None (Room air)   PainSc: Five   Weight: 122.9 kg (271 lb)   Height: 185.4 cm (6' 1)     Body mass index is 35.75 kg/m?Mark Pace     Past Medical History  Patient Active Problem List    Diagnosis Date Noted    COVID-19 vaccine series not administered 03/31/2020    Weight gain 03/31/2020    Weight loss 03/04/2018    Mixed hyperlipidemia 11/12/2017    Non morbid obesity due to excess calories 04/17/2016    RBBB 04/17/2016    Palpitations 04/12/2016    Hypertension 04/12/2016    Anxiety 04/12/2016    Family history of early CAD 04/12/2016         Review of Systems   Constitutional: Negative.   HENT: Negative.     Eyes: Negative.    Cardiovascular: Negative.    Respiratory: Negative.     Endocrine: Negative.    Hematologic/Lymphatic: Negative.    Skin: Negative.    Musculoskeletal:  Positive for back pain and joint pain.   Gastrointestinal:  Positive for heartburn.   Genitourinary: Negative. Neurological: Negative.    Psychiatric/Behavioral: Negative.     Allergic/Immunologic: Negative.        Physical Exam  General Appearance: obese, BMI = 35.75 kg/m?  Skin: warm, moist, no ulcers or xanthomas  Eyes: conjunctivae and lids normal, pupils are equal and round  Lips & Oral Mucosa: no pallor or cyanosis  Neck Veins: neck veins are flat, neck veins are not distended  Chest Inspection: chest is normal in appearance  Respiratory Effort: breathing comfortably, no respiratory distress  Auscultation/Percussion: lungs clear to auscultation, no rales or rhonchi, no wheezing  Cardiac Rhythm: regular rhythm and normal rate  Cardiac Auscultation: S1, S2 normal, no rub, no gallop  Murmurs: no murmur  Carotid Arteries: normal carotid upstroke bilaterally, no bruit  Abdominal aorta: could not be examined due to obese adomen  Lower Extremity Edema: no lower extremity edema  Abdominal Exam: soft, non-tender, no masses, bowel sounds normal  Liver & Spleen: no organomegaly  Language and Memory: patient responsive and seems to comprehend information  Neurologic Exam: neurological assessment grossly intact      Cardiovascular Studies      Cardiovascular Health Factors  Vitals BP Readings from Last 3 Encounters:   06/11/24 131/89   04/24/24 130/85   08/20/23 ROLLEN)  149/95     Wt Readings from Last 3 Encounters:   06/11/24 122.9 kg (271 lb)   04/24/24 121.6 kg (268 lb)   08/20/23 121.9 kg (268 lb 12.8 oz)     BMI Readings from Last 3 Encounters:   06/11/24 35.75 kg/m?   04/24/24 35.36 kg/m?   08/20/23 35.46 kg/m?      Smoking Tobacco Use History[1]   Lipid Profile Cholesterol   Date Value Ref Range Status   04/24/2024 169  Final     HDL   Date Value Ref Range Status   04/24/2024 44  Final     LDL   Date Value Ref Range Status   04/24/2024 88  Final     Triglycerides   Date Value Ref Range Status   04/24/2024 187 (H) <150 Final      Blood Sugar Hemoglobin A1C   Date Value Ref Range Status   04/24/2024 5.5  Final     Glucose Date Value Ref Range Status   04/24/2024 117 (H) 70 - 105 Final   02/08/2023 97  Final   12/19/2021 95  Final          Problems Addressed Today  Encounter Diagnoses   Name Primary?    RBBB Yes    Mixed hyperlipidemia     Primary hypertension     Family history of early CAD     COVID-19 vaccine series not administered     Non morbid obesity due to excess calories     Weight gain     Other irritable bowel syndrome     History of melanoma        Assessment and Plan     Assessment:    1.  Primary hypertension  Patient continues on 3 antihypertensive drug therapy that include amlodipine , chlorthalidone  and losartan   Patient's blood pressure is under reasonable control  2.  Obesity, elevated BMI = 35.75 kg/m?  Due to dietary indiscretion patient has been unsuccessful to losing weight  3.  History of heart palpitations-resolved  4.  No documented history of CAD and no symptoms compatible with angina  5.  Irritable bowel syndrome-likely enhanced by consumption of junk food and weight gain  6.  History of melanoma  Status post resection of the left lower lobe posterior thorax, patient continues to be in complete remission    Plan:    1.  Continue all current medications  2.  Continue recycles modification, again I advised against consumption of junk food, soda pop, I encouraged to consume more proteins than sugar and carbohydrates  3.  Follow-up with cardiology as needed.    Total Time Today was 30 minutes in the following activities: Preparing to see the patient, Obtaining and/or reviewing separately obtained history, Performing a medically appropriate examination and/or evaluation, Counseling and educating the patient/family/caregiver, Ordering medications, tests, or procedures, Referring and communication with other health care professionals (when not separately reported), Documenting clinical information in the electronic or other health record, Independently interpreting results (not separately reported) and communicating results to the patient/family/caregiver, and Care coordination (not separately reported)            Current Medications (including today's revisions)   acetaminophen (TYLENOL) 500 mg tablet Take one tablet by mouth twice daily as needed for Pain. Max of 4,000 mg of acetaminophen in 24 hours.    amLODIPine  (NORVASC ) 10 mg tablet Take 1 tablet by mouth once daily    chlorthalidone  (HYGROTON ) 25 mg tablet TAKE 1  TABLET BY MOUTH ONCE DAILY FOR HIGH BLOOD PRESSURE    coffee xt/phosphatidyl serine (NEURIVA ORIGINAL PO) Take  by mouth daily.    duloxetine DR (CYMBALTA) 60 mg capsule Take one capsule by mouth daily.    ibuprofen (ADVIL) 200 mg tablet Take three tablets to four tablets by mouth as Needed for Pain. Take with food.    losartan  (COZAAR ) 50 mg tablet TAKE 1 TABLET BY MOUTH TWICE DAILY FOR BLOOD PRESSURE    MULTIVITAMIN PO Take  by mouth daily.    psyllium (METAMUCIL) 3.4 gram packet Take one packet by mouth daily.    tamsulosin (FLOMAX) 0.4 mg capsule Take one capsule by mouth daily.    traZODone (DESYREL) 50 mg tablet Take one tablet by mouth at bedtime daily.                 [1]   Social History  Tobacco Use   Smoking Status Never   Smokeless Tobacco Never

## 2024-09-01 ENCOUNTER — Encounter
Admit: 2024-09-01 | Discharge: 2024-09-01 | Payer: PRIVATE HEALTH INSURANCE | Primary: Student in an Organized Health Care Education/Training Program
